# Patient Record
Sex: Male | Born: 1985 | Race: Black or African American | Hispanic: No | Marital: Single | Smoking: Never smoker
Health system: Southern US, Community
[De-identification: ages and names within clinical notes are randomized; demographics above are authoritative.]

---

## 2010-10-01 ENCOUNTER — Emergency Department (HOSPITAL_BASED_OUTPATIENT_CLINIC_OR_DEPARTMENT_OTHER)
Admission: EM | Admit: 2010-10-01 | Discharge: 2010-10-01 | Payer: Self-pay | Source: Home / Self Care | Admitting: Emergency Medicine

## 2011-09-30 ENCOUNTER — Emergency Department (INDEPENDENT_AMBULATORY_CARE_PROVIDER_SITE_OTHER): Payer: Self-pay

## 2011-09-30 ENCOUNTER — Encounter: Payer: Self-pay | Admitting: Emergency Medicine

## 2011-09-30 ENCOUNTER — Emergency Department (HOSPITAL_BASED_OUTPATIENT_CLINIC_OR_DEPARTMENT_OTHER)
Admission: EM | Admit: 2011-09-30 | Discharge: 2011-09-30 | Disposition: A | Discharge status: Home / Self Care | Payer: Self-pay | Attending: Emergency Medicine | Admitting: Emergency Medicine

## 2011-09-30 DIAGNOSIS — IMO0002 Reserved for concepts with insufficient information to code with codable children: Secondary | ICD-10-CM

## 2011-09-30 DIAGNOSIS — M542 Cervicalgia: Secondary | ICD-10-CM

## 2011-09-30 DIAGNOSIS — S0180XA Unspecified open wound of other part of head, initial encounter: Secondary | ICD-10-CM

## 2011-09-30 DIAGNOSIS — R6884 Jaw pain: Secondary | ICD-10-CM | POA: Insufficient documentation

## 2011-09-30 DIAGNOSIS — T148XXA Other injury of unspecified body region, initial encounter: Secondary | ICD-10-CM | POA: Insufficient documentation

## 2011-09-30 MED ORDER — HYDROCODONE-ACETAMINOPHEN 5-500 MG PO TABS: 1.0000 | ORAL_TABLET | Freq: Four times a day (QID) | ORAL | Status: AC | PRN

## 2011-09-30 NOTE — ED Notes (Signed)
Facial swelling with eccymosis below eyes

## 2011-09-30 NOTE — ED Notes (Signed)
Facial swelling and pain controll reviewed
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
## 2011-09-30 NOTE — ED Notes (Signed)
Pt presents with R sided facial pain and swelling difficulty chewing/pt reports possible STD as well

## 2011-10-01 NOTE — ED Provider Notes (Signed)
Medical screening examination/treatment/procedure(s) were performed by non-physician practitioner and as supervising physician I was immediately available for consultation/collaboration.   Dayton Bailiff, MD 10/01/11 [phone]

## 2014-12-08 ENCOUNTER — Encounter (HOSPITAL_BASED_OUTPATIENT_CLINIC_OR_DEPARTMENT_OTHER): Payer: Self-pay | Admitting: *Deleted

## 2014-12-08 ENCOUNTER — Emergency Department (HOSPITAL_BASED_OUTPATIENT_CLINIC_OR_DEPARTMENT_OTHER)
Admission: EM | Admit: 2014-12-08 | Discharge: 2014-12-08 | Disposition: A | Discharge status: Home / Self Care | Payer: No Typology Code available for payment source | Attending: Emergency Medicine | Admitting: Emergency Medicine

## 2014-12-08 DIAGNOSIS — Y9389 Activity, other specified: Secondary | ICD-10-CM | POA: Insufficient documentation

## 2014-12-08 DIAGNOSIS — S46811A Strain of other muscles, fascia and tendons at shoulder and upper arm level, right arm, initial encounter: Secondary | ICD-10-CM

## 2014-12-08 DIAGNOSIS — Z7982 Long term (current) use of aspirin: Secondary | ICD-10-CM | POA: Diagnosis not present

## 2014-12-08 DIAGNOSIS — S161XXA Strain of muscle, fascia and tendon at neck level, initial encounter: Secondary | ICD-10-CM | POA: Insufficient documentation

## 2014-12-08 DIAGNOSIS — Y92410 Unspecified street and highway as the place of occurrence of the external cause: Secondary | ICD-10-CM | POA: Insufficient documentation

## 2014-12-08 DIAGNOSIS — S199XXA Unspecified injury of neck, initial encounter: Secondary | ICD-10-CM | POA: Diagnosis present

## 2014-12-08 DIAGNOSIS — S46911A Strain of unspecified muscle, fascia and tendon at shoulder and upper arm level, right arm, initial encounter: Secondary | ICD-10-CM | POA: Insufficient documentation

## 2014-12-08 DIAGNOSIS — Y998 Other external cause status: Secondary | ICD-10-CM | POA: Insufficient documentation

## 2014-12-08 MED ORDER — METHOCARBAMOL 500 MG PO TABS
500.0000 mg | ORAL_TABLET | Freq: Once | ORAL | Status: AC
Start: 2014-12-08 — End: 2014-12-08
  Administered 2014-12-08: 500 mg via ORAL
  Filled 2014-12-08: qty 1

## 2014-12-08 MED ORDER — NAPROXEN 500 MG PO TABS: 500.0000 mg | ORAL_TABLET | Freq: Two times a day (BID) | ORAL | Status: DC

## 2014-12-08 MED ORDER — METHOCARBAMOL 500 MG PO TABS: 500.0000 mg | ORAL_TABLET | Freq: Two times a day (BID) | ORAL | Status: DC

## 2014-12-08 MED ORDER — IBUPROFEN 800 MG PO TABS
800.0000 mg | ORAL_TABLET | Freq: Once | ORAL | Status: AC
  Administered 2014-12-08: 800 mg via ORAL
  Filled 2014-12-08: qty 1
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
## 2014-12-08 NOTE — Discharge Instructions (Signed)
1. Medications: robaxin, naproxyn, usual home medications °2. Treatment: rest, drink plenty of fluids, gentle stretching as discussed, alternate ice and heat °3. Follow Up: Please followup with your primary doctor in 3 days for discussion of your diagnoses and further evaluation after today's visit; if you do not have a primary care doctor use the resource guide provided to find one;  Return to the ER for worsening back pain, difficulty walking, loss of bowel or bladder control or other concerning symptoms ° ° ° °Back Exercises °Back exercises help treat and prevent back injuries. The goal of back exercises is to increase the strength of your abdominal and back muscles and the flexibility of your back. These exercises should be started when you no longer have back pain. Back exercises include: °· Pelvic Tilt. Lie on your back with your knees bent. Tilt your pelvis until the lower part of your back is against the floor. Hold this position 5 to 10 sec and repeat 5 to 10 times. °· Knee to Chest. Pull first 1 knee up against your chest and hold for 20 to 30 seconds, repeat this with the other knee, and then both knees. This may be done with the other leg straight or bent, whichever feels better. °· Sit-Ups or Curl-Ups. Bend your knees 90 degrees. Start with tilting your pelvis, and do a partial, slow sit-up, lifting your trunk only 30 to 45 degrees off the floor. Take at least 2 to 3 seconds for each sit-up. Do not do sit-ups with your knees out straight. If partial sit-ups are difficult, simply do the above but with only tightening your abdominal muscles and holding it as directed. °· Hip-Lift. Lie on your back with your knees flexed 90 degrees. Push down with your feet and shoulders as you raise your hips a couple inches off the floor; hold for 10 seconds, repeat 5 to 10 times. °· Back arches. Lie on your stomach, propping yourself up on bent elbows. Slowly press on your hands, causing an arch in your low back. Repeat  3 to 5 times. Any initial stiffness and discomfort should lessen with repetition over time. °· Shoulder-Lifts. Lie face down with arms beside your body. Keep hips and torso pressed to floor as you slowly lift your head and shoulders off the floor. °Do not overdo your exercises, especially in the beginning. Exercises may cause you some mild back discomfort which lasts for a few minutes; however, if the pain is more severe, or lasts for more than 15 minutes, do not continue exercises until you see your caregiver. Improvement with exercise therapy for back problems is slow.  °See your caregivers for assistance with developing a proper back exercise program. °Document Released: 11/22/2004 Document Revised: 01/07/2012 Document Reviewed: 08/16/2011 °[employer]® Patient Information ©2015 [employer]. This information is not intended to replace advice given to you by your health care provider. Make sure you discuss any questions you have with your health care provider. ° °
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
## 2017-08-16 ENCOUNTER — Encounter (HOSPITAL_BASED_OUTPATIENT_CLINIC_OR_DEPARTMENT_OTHER): Payer: Self-pay | Admitting: *Deleted

## 2017-08-16 DIAGNOSIS — Y939 Activity, unspecified: Secondary | ICD-10-CM | POA: Diagnosis not present

## 2017-08-16 DIAGNOSIS — Y999 Unspecified external cause status: Secondary | ICD-10-CM | POA: Insufficient documentation

## 2017-08-16 DIAGNOSIS — Z7982 Long term (current) use of aspirin: Secondary | ICD-10-CM | POA: Insufficient documentation

## 2017-08-16 DIAGNOSIS — R51 Headache: Secondary | ICD-10-CM | POA: Diagnosis not present

## 2017-08-16 DIAGNOSIS — Z79899 Other long term (current) drug therapy: Secondary | ICD-10-CM | POA: Insufficient documentation
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
## 2017-08-17 ENCOUNTER — Emergency Department (HOSPITAL_BASED_OUTPATIENT_CLINIC_OR_DEPARTMENT_OTHER)
Admission: EM | Admit: 2017-08-17 | Discharge: 2017-08-17 | Disposition: A | Discharge status: Home / Self Care | Payer: No Typology Code available for payment source | Attending: Emergency Medicine | Admitting: Emergency Medicine

## 2017-08-17 DIAGNOSIS — R519 Headache, unspecified: Secondary | ICD-10-CM

## 2017-08-17 DIAGNOSIS — R51 Headache: Secondary | ICD-10-CM

## 2017-08-17 MED ORDER — NAPROXEN 500 MG PO TBEC: 500.0000 mg | DELAYED_RELEASE_TABLET | Freq: Two times a day (BID) | ORAL | 0 refills | Status: DC
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
## 2017-08-17 NOTE — Discharge Instructions (Signed)
Please return to the ER if the headache gets severe and in not improving, you have associated new one sided numbness, tingling, weakness or confusion, seizures, poor balance or poor vision.  Take the meds prescribed.

## 2017-09-24 ENCOUNTER — Emergency Department (HOSPITAL_BASED_OUTPATIENT_CLINIC_OR_DEPARTMENT_OTHER)
Admission: EM | Admit: 2017-09-24 | Discharge: 2017-09-24 | Disposition: A | Discharge status: Home / Self Care | Payer: Self-pay | Attending: Emergency Medicine | Admitting: Emergency Medicine

## 2017-09-24 ENCOUNTER — Emergency Department (HOSPITAL_BASED_OUTPATIENT_CLINIC_OR_DEPARTMENT_OTHER): Payer: Self-pay

## 2017-09-24 ENCOUNTER — Encounter (HOSPITAL_BASED_OUTPATIENT_CLINIC_OR_DEPARTMENT_OTHER): Payer: Self-pay | Admitting: *Deleted

## 2017-09-24 ENCOUNTER — Other Ambulatory Visit: Payer: Self-pay

## 2017-09-24 DIAGNOSIS — R42 Dizziness and giddiness: Secondary | ICD-10-CM | POA: Insufficient documentation

## 2017-09-24 DIAGNOSIS — R51 Headache: Secondary | ICD-10-CM | POA: Insufficient documentation

## 2017-09-24 DIAGNOSIS — Z79899 Other long term (current) drug therapy: Secondary | ICD-10-CM | POA: Insufficient documentation

## 2017-09-24 DIAGNOSIS — R200 Anesthesia of skin: Secondary | ICD-10-CM | POA: Insufficient documentation

## 2017-09-24 DIAGNOSIS — F40298 Other specified phobia: Secondary | ICD-10-CM | POA: Insufficient documentation

## 2017-09-24 DIAGNOSIS — H538 Other visual disturbances: Secondary | ICD-10-CM | POA: Insufficient documentation

## 2017-09-24 DIAGNOSIS — Z7982 Long term (current) use of aspirin: Secondary | ICD-10-CM | POA: Insufficient documentation

## 2017-09-24 DIAGNOSIS — R519 Headache, unspecified: Secondary | ICD-10-CM

## 2017-09-24 MED ORDER — KETOROLAC TROMETHAMINE 60 MG/2ML IM SOLN
60.0000 mg | Freq: Once | INTRAMUSCULAR | Status: AC
  Administered 2017-09-24: 60 mg via INTRAMUSCULAR
  Filled 2017-09-24: qty 2

## 2017-09-24 MED ORDER — ONDANSETRON 4 MG PO TBDP
4.0000 mg | ORAL_TABLET | Freq: Once | ORAL | Status: AC
  Administered 2017-09-24: 4 mg via ORAL
  Filled 2017-09-24: qty 1

## 2017-09-24 NOTE — Discharge Instructions (Signed)
Please call [facility] to schedule a follow-up appointment from your visit today.  The office contact information is listed above.  Please call the number listed on your discharge paperwork to get established with a primary care provider so that he will have someone for follow-up.  If you develop new or worsening symptoms, including loss of vision in one or both eyes, the worst headache of your life, weakness or if you become unable to walk, or other concerning symptoms, please return to the emergency department for reevaluation.

## 2017-09-24 NOTE — ED Triage Notes (Signed)
Pt c/o left arm and leg numbness x 1 month also c/o h/a " off and on"

## 2017-09-24 NOTE — ED Notes (Signed)
ED Provider at bedside. 
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
## 2024-01-04 ENCOUNTER — Emergency Department (HOSPITAL_BASED_OUTPATIENT_CLINIC_OR_DEPARTMENT_OTHER)
Admission: EM | Admit: 2024-01-04 | Discharge: 2024-01-04 | Disposition: A | Discharge status: Home / Self Care | Attending: Emergency Medicine | Admitting: Emergency Medicine

## 2024-01-04 ENCOUNTER — Emergency Department (HOSPITAL_BASED_OUTPATIENT_CLINIC_OR_DEPARTMENT_OTHER)

## 2024-01-04 ENCOUNTER — Other Ambulatory Visit: Payer: Self-pay

## 2024-01-04 ENCOUNTER — Encounter (HOSPITAL_BASED_OUTPATIENT_CLINIC_OR_DEPARTMENT_OTHER): Payer: Self-pay

## 2024-01-04 DIAGNOSIS — R519 Headache, unspecified: Secondary | ICD-10-CM | POA: Insufficient documentation

## 2024-01-04 DIAGNOSIS — M25512 Pain in left shoulder: Secondary | ICD-10-CM | POA: Insufficient documentation

## 2024-01-04 DIAGNOSIS — M542 Cervicalgia: Secondary | ICD-10-CM | POA: Diagnosis not present

## 2024-01-04 DIAGNOSIS — Y92410 Unspecified street and highway as the place of occurrence of the external cause: Secondary | ICD-10-CM | POA: Diagnosis not present

## 2024-01-04 DIAGNOSIS — Z7982 Long term (current) use of aspirin: Secondary | ICD-10-CM | POA: Insufficient documentation

## 2024-01-04 MED ORDER — IBUPROFEN 200 MG PO TABS
ORAL_TABLET | ORAL | Status: AC
  Filled 2024-01-04: qty 3

## 2024-01-04 MED ORDER — METHOCARBAMOL 500 MG PO TABS: 500.0000 mg | ORAL_TABLET | Freq: Two times a day (BID) | ORAL | 0 refills | Status: AC | PRN

## 2024-01-04 MED ORDER — MELOXICAM 15 MG PO TABS: 15.0000 mg | ORAL_TABLET | Freq: Every day | ORAL | 0 refills | Status: AC | PRN

## 2024-01-04 MED ORDER — IBUPROFEN 400 MG PO TABS
600.0000 mg | ORAL_TABLET | Freq: Once | ORAL | Status: AC
  Administered 2024-01-04: 600 mg via ORAL

## 2024-01-04 NOTE — Discharge Instructions (Addendum)
 As discussed, imaging studies were negative for any fracture or dislocation.  Will treat you with anti-inflammatories as well as muscle laxer to use as needed.  The muscle relaxer can cause drowsiness or please no driving or performing high risk activity and to utilize its effects on you.  Recommend follow-up with your primary care for reassessment.  Please do not hesitate to return to emergency department if the worrisome signs and symptoms we discussed become apparent.
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
